# Patient Record
Sex: Male | Born: 1990 | Race: Black or African American | Hispanic: No | Marital: Single | Smoking: Current every day smoker
Health system: Southern US, Community
[De-identification: ages and names within clinical notes are randomized; demographics above are authoritative.]

---
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
## 2009-05-01 IMAGING — CT CT ABDOMEN W/ CM
2 of 5 series · 17 of 46 positions shown, 19 images · IV contrast (OMNI 300/WATER & 80 ML OMNI 300)
Comparison: None

CT ABDOMEN

CLINICAL DATA: Diarrhea urinary frequency

CT ABDOMEN AND PELVIS WITH CONTRAST
TECHNIQUE: Multidetector CT imaging of the abdomen and pelvis was
performed using the standard protocol following bolus
administration of intravenous contrast.
Contrast: 80 ml Mmnipaque-SLL IV

[Series 2: routine abdomen · axial · 0.61mm/px · z∈[-445,-60]mm · 14 of 87 slices shown, 16 images]
[im 5/87  soft-tissue]
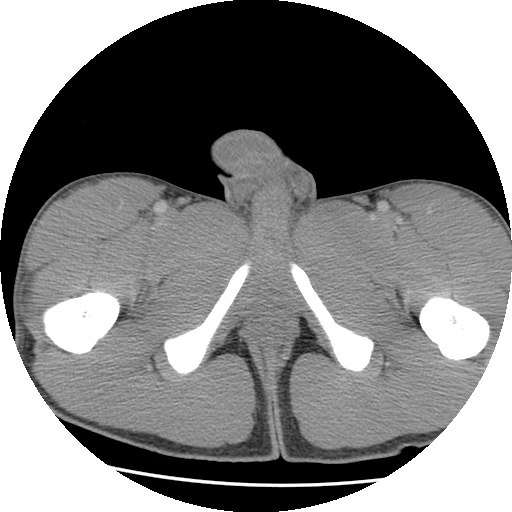
[im 5/87  bone]
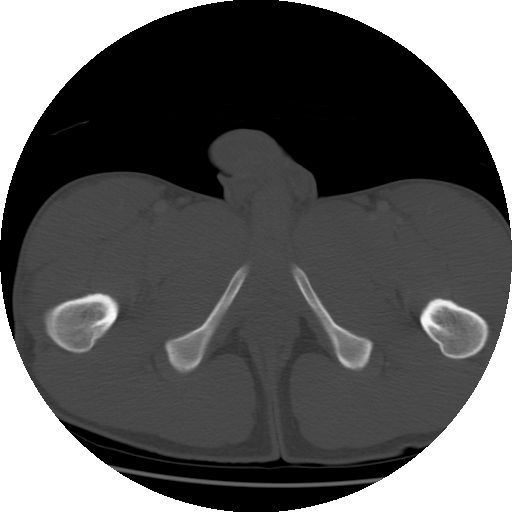
[im 10/87  soft-tissue]
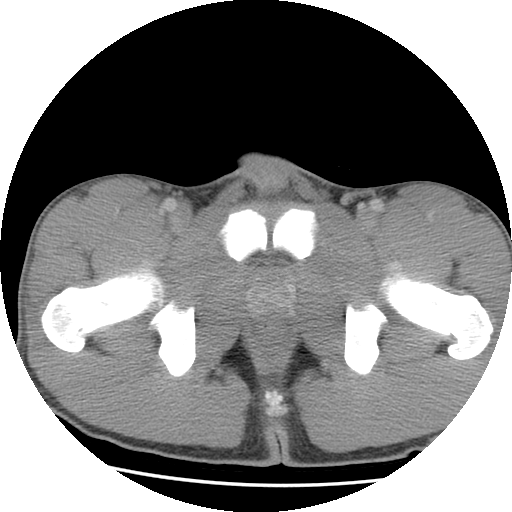
[im 19/87  soft-tissue]
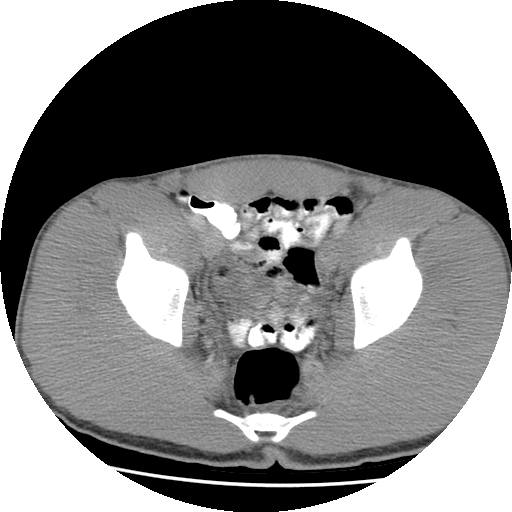
[im 23/87  soft-tissue]
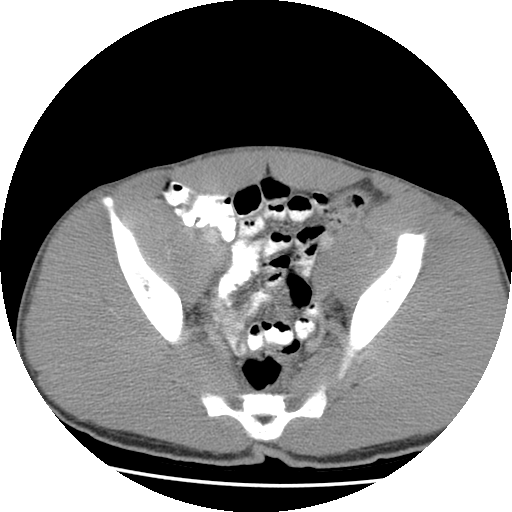
[im 28/87  soft-tissue]
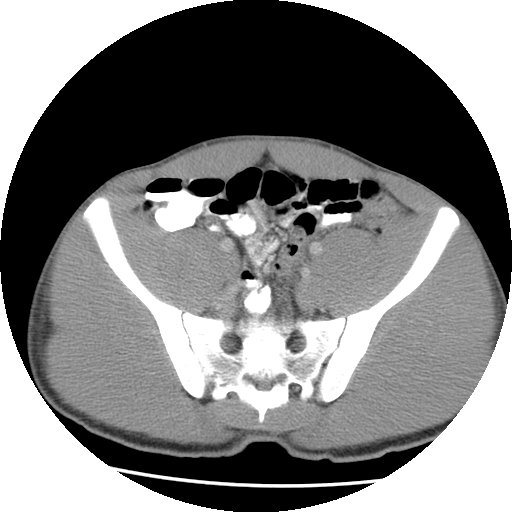
[im 37/87  soft-tissue]
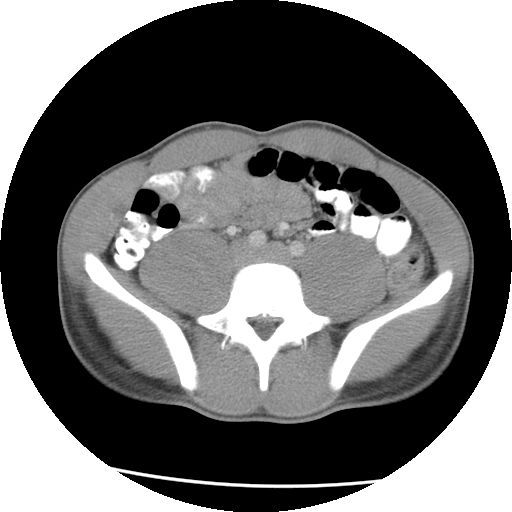
[im 41/87  soft-tissue]
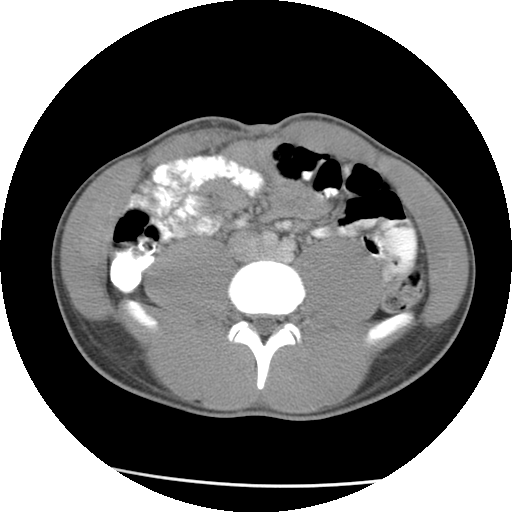
[im 46/87  soft-tissue]
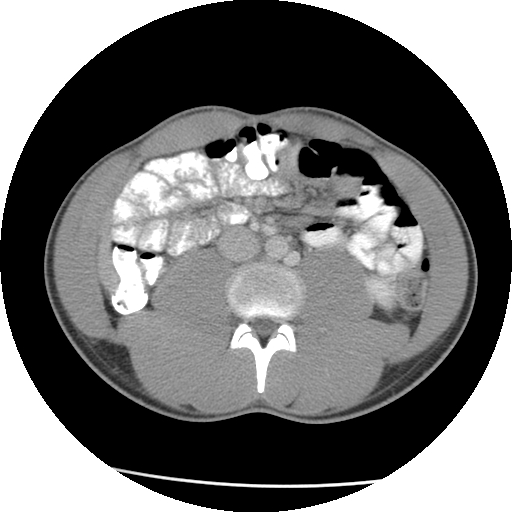
[im 50/87  soft-tissue]
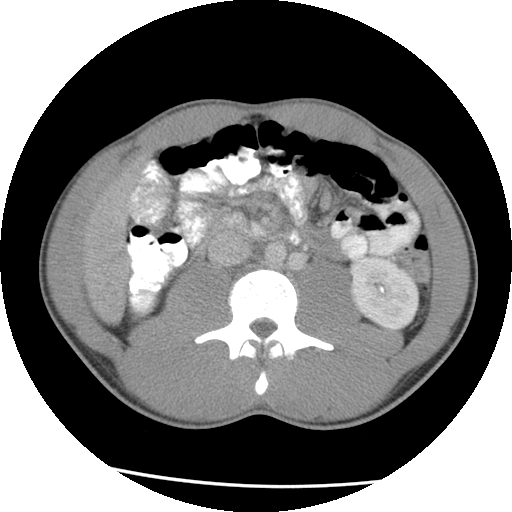
[im 50/87  bone]
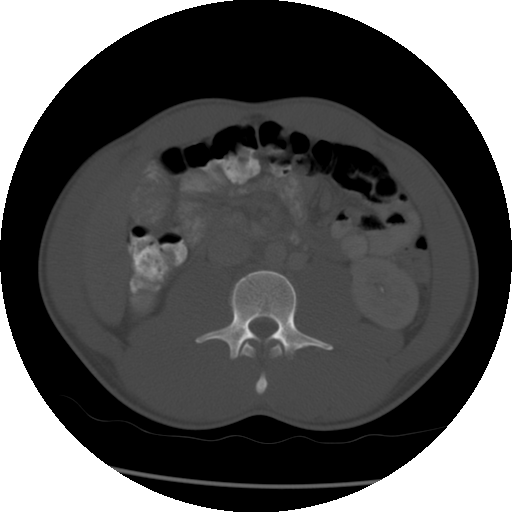
[im 59/87  soft-tissue]
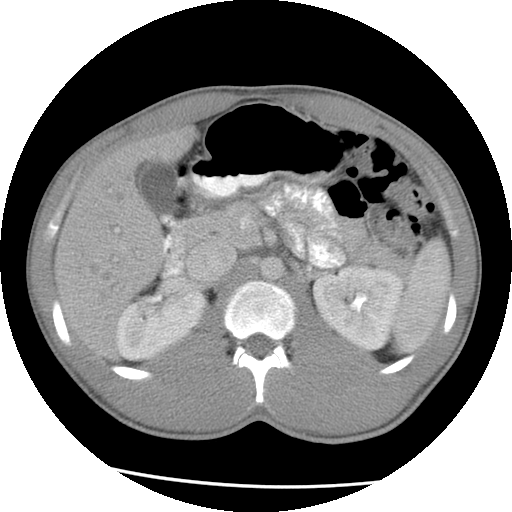
[im 64/87  soft-tissue]
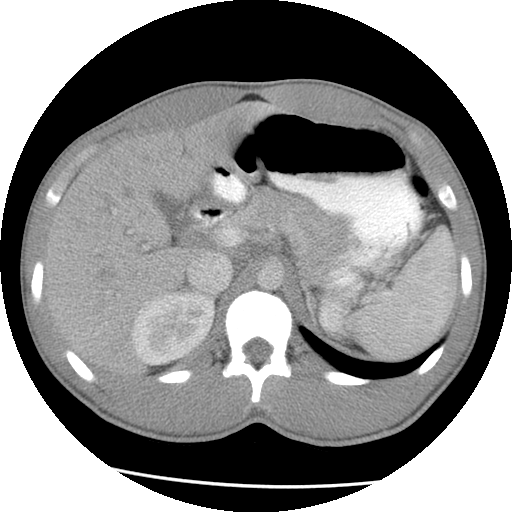
[im 68/87  soft-tissue]
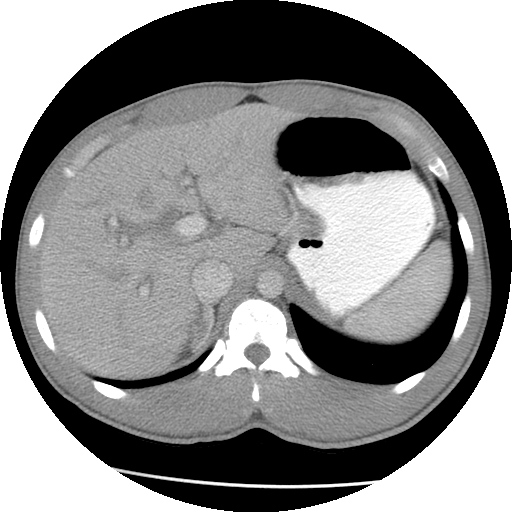
[im 77/87  soft-tissue]
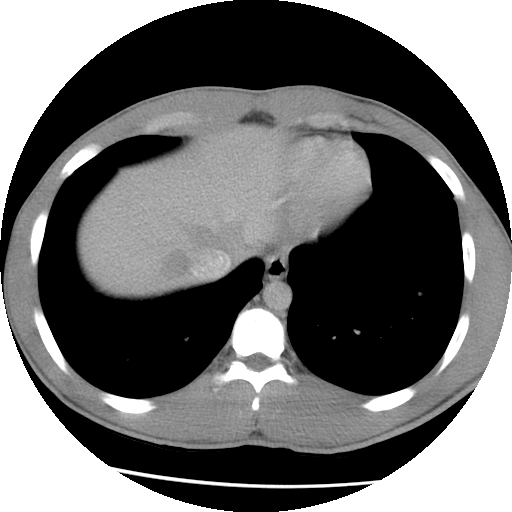
[im 82/87  soft-tissue]
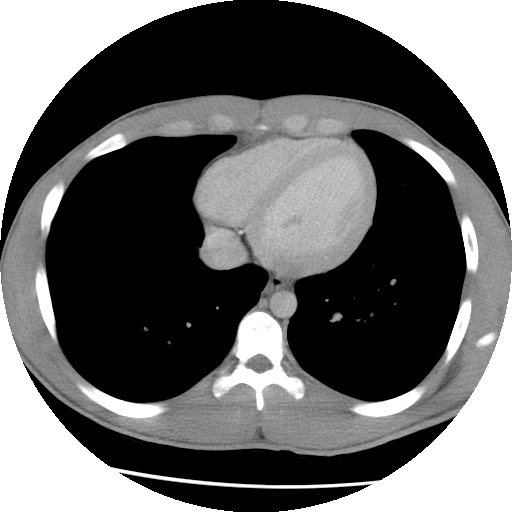

[Series 401: reformatted · coronal · 0.86mm/px · 3 of 102 slices shown]
[im 34/102  soft-tissue]
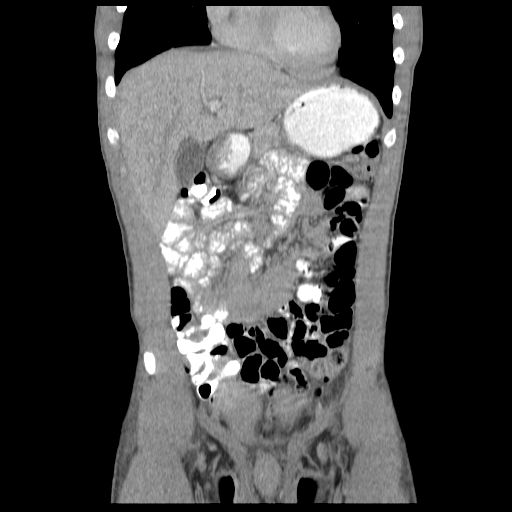
[im 45/102  soft-tissue]
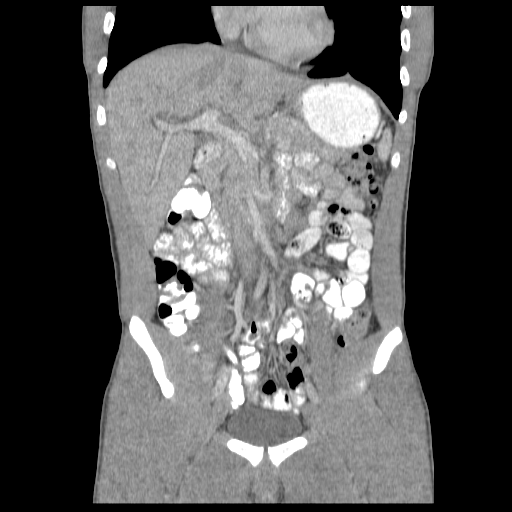
[im 57/102  soft-tissue]
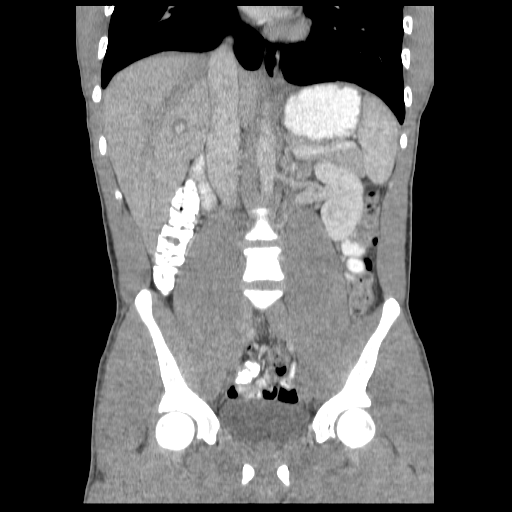

[17 of 46 positions shown; findings below may reference images not displayed]

FINDINGS: There is mild pericholecystic fluid around the
gallbladder.  Gallbladder wall does not appear to be thickened and
there is no ascites.  No calcified gallstones are present and there
is no biliary dilatation.  The liver spleen pancreas and kidneys
are normal the bowel is nondilated there is no adenopathy.
IMPRESSION: There is mild pericholecystic fluid.  Recommend physical
examination of the gallbladder to evaluate for tenderness.
Ultrasound may be helpful to evaluate the gallbladder for stones.

CT PELVIS
FINDINGS: The bowel appears normal and the appendix appears
normal.  There is no free fluid mass or adenopathy.
IMPRESSION: No acute abnormality in the abdomen.

## 2009-05-08 ENCOUNTER — Emergency Department (HOSPITAL_COMMUNITY): Admission: EM | Admit: 2009-05-08 | Discharge: 2009-05-08 | Payer: Self-pay | Admitting: Emergency Medicine

## 2009-06-06 ENCOUNTER — Emergency Department (HOSPITAL_COMMUNITY): Admission: EM | Admit: 2009-06-06 | Discharge: 2009-06-06 | Payer: Self-pay | Admitting: Emergency Medicine

## 2009-07-09 ENCOUNTER — Emergency Department (HOSPITAL_COMMUNITY): Admission: EM | Admit: 2009-07-09 | Discharge: 2009-07-09 | Payer: Self-pay | Admitting: Emergency Medicine

## 2009-08-21 ENCOUNTER — Emergency Department (HOSPITAL_COMMUNITY): Admission: EM | Admit: 2009-08-21 | Discharge: 2009-08-21 | Payer: Self-pay | Admitting: Emergency Medicine

## 2009-09-19 ENCOUNTER — Emergency Department (HOSPITAL_COMMUNITY): Admission: EM | Admit: 2009-09-19 | Discharge: 2009-09-20 | Payer: Self-pay | Admitting: Emergency Medicine

## 2010-03-04 ENCOUNTER — Emergency Department (HOSPITAL_COMMUNITY): Admission: EM | Admit: 2010-03-04 | Discharge: 2010-03-04 | Payer: Self-pay | Admitting: Emergency Medicine

## 2010-03-05 ENCOUNTER — Emergency Department (HOSPITAL_COMMUNITY): Admission: EM | Admit: 2010-03-05 | Discharge: 2010-03-05 | Payer: Self-pay | Admitting: Emergency Medicine

## 2010-12-04 ENCOUNTER — Emergency Department (HOSPITAL_COMMUNITY)
Admission: EM | Admit: 2010-12-04 | Discharge: 2010-12-04 | Payer: Self-pay | Source: Home / Self Care | Admitting: Emergency Medicine

## 2011-02-14 LAB — URINE MICROSCOPIC-ADD ON

## 2011-02-14 LAB — URINALYSIS, ROUTINE W REFLEX MICROSCOPIC
Bilirubin Urine: NEGATIVE
Glucose, UA: NEGATIVE mg/dL
Ketones, ur: NEGATIVE mg/dL
Protein, ur: NEGATIVE mg/dL
Urobilinogen, UA: 1 mg/dL (ref 0.0–1.0)
pH: 5.5 (ref 5.0–8.0)

## 2011-02-17 LAB — POCT I-STAT, CHEM 8
BUN: 12 mg/dL (ref 6–23)
Calcium, Ion: 1.16 mmol/L (ref 1.12–1.32)
Chloride: 103 mEq/L (ref 96–112)
Creatinine, Ser: 1.4 mg/dL (ref 0.4–1.5)
Glucose, Bld: 89 mg/dL (ref 70–99)
TCO2: 27 mmol/L (ref 0–100)

## 2011-02-17 LAB — POCT CARDIAC MARKERS: Myoglobin, poc: 39.9 ng/mL (ref 12–200)

## 2011-02-18 LAB — GC/CHLAMYDIA PROBE AMP, GENITAL
Chlamydia, DNA Probe: NEGATIVE
GC Probe Amp, Genital: NEGATIVE

## 2011-02-18 LAB — URINALYSIS, ROUTINE W REFLEX MICROSCOPIC
Nitrite: NEGATIVE
Protein, ur: NEGATIVE mg/dL
Urobilinogen, UA: 1 mg/dL (ref 0.0–1.0)

## 2011-02-18 LAB — URINE MICROSCOPIC-ADD ON

## 2011-02-19 LAB — GC/CHLAMYDIA PROBE AMP, GENITAL: GC Probe Amp, Genital: NEGATIVE

## 2011-08-06 LAB — DIFFERENTIAL
Eosinophils Relative: 1
Lymphs Abs: 1.4
Monocytes Relative: 12
Neutro Abs: 2.8
Neutrophils Relative %: 58

## 2011-08-06 LAB — POCT I-STAT, CHEM 8
BUN: 9
Calcium, Ion: 1.19
Calcium, Ion: 1.2
Chloride: 103
Creatinine, Ser: 1.4
Creatinine, Ser: 1.5
HCT: 47
HCT: 49
Potassium: 4.5
Sodium: 143
TCO2: 30

## 2011-08-06 LAB — URINALYSIS, ROUTINE W REFLEX MICROSCOPIC
Hgb urine dipstick: NEGATIVE
Ketones, ur: NEGATIVE
Nitrite: NEGATIVE
Specific Gravity, Urine: 1.02

## 2011-08-06 LAB — CBC
Hemoglobin: 14.9
RDW: 13.4

## 2011-08-06 LAB — URINE MICROSCOPIC-ADD ON

## 2011-08-07 LAB — URINE CULTURE: Colony Count: NO GROWTH

## 2011-08-07 LAB — URINALYSIS, ROUTINE W REFLEX MICROSCOPIC
Bilirubin Urine: NEGATIVE
Glucose, UA: NEGATIVE
Hgb urine dipstick: NEGATIVE
Ketones, ur: NEGATIVE
Nitrite: NEGATIVE
pH: 8.5 — ABNORMAL HIGH

## 2011-08-07 LAB — URINE MICROSCOPIC-ADD ON

## 2011-08-07 LAB — GC/CHLAMYDIA PROBE AMP, GENITAL: Chlamydia, DNA Probe: NEGATIVE

## 2011-08-09 LAB — URINALYSIS, ROUTINE W REFLEX MICROSCOPIC
Bilirubin Urine: NEGATIVE
Glucose, UA: NEGATIVE
Hgb urine dipstick: NEGATIVE
Ketones, ur: 15 — AB
Protein, ur: NEGATIVE

## 2011-08-09 LAB — URINE MICROSCOPIC-ADD ON

## 2011-08-09 LAB — RPR: RPR Ser Ql: NONREACTIVE

## 2011-08-09 LAB — GC/CHLAMYDIA PROBE AMP, GENITAL: GC Probe Amp, Genital: NEGATIVE

## 2011-08-20 LAB — GC/CHLAMYDIA PROBE AMP, GENITAL: Chlamydia, DNA Probe: NEGATIVE

## 2011-08-20 LAB — URINALYSIS, ROUTINE W REFLEX MICROSCOPIC
Bilirubin Urine: NEGATIVE
Glucose, UA: NEGATIVE
Ketones, ur: 15 — AB
Nitrite: NEGATIVE
Protein, ur: NEGATIVE

## 2013-02-15 ENCOUNTER — Emergency Department (HOSPITAL_COMMUNITY): Payer: No Typology Code available for payment source

## 2013-02-15 ENCOUNTER — Emergency Department (HOSPITAL_COMMUNITY)
Admission: EM | Admit: 2013-02-15 | Discharge: 2013-02-16 | Disposition: A | Discharge status: Home / Self Care | Payer: No Typology Code available for payment source | Attending: Emergency Medicine | Admitting: Emergency Medicine

## 2013-02-15 ENCOUNTER — Encounter (HOSPITAL_COMMUNITY): Payer: Self-pay | Admitting: *Deleted

## 2013-02-15 DIAGNOSIS — Y9389 Activity, other specified: Secondary | ICD-10-CM | POA: Insufficient documentation

## 2013-02-15 DIAGNOSIS — Y92410 Unspecified street and highway as the place of occurrence of the external cause: Secondary | ICD-10-CM | POA: Insufficient documentation

## 2013-02-15 DIAGNOSIS — S7001XA Contusion of right hip, initial encounter: Secondary | ICD-10-CM

## 2013-02-15 DIAGNOSIS — S7000XA Contusion of unspecified hip, initial encounter: Secondary | ICD-10-CM | POA: Insufficient documentation

## 2013-02-15 DIAGNOSIS — F17200 Nicotine dependence, unspecified, uncomplicated: Secondary | ICD-10-CM | POA: Insufficient documentation

## 2013-02-15 MED ORDER — IOHEXOL 300 MG/ML  SOLN
100.0000 mL | Freq: Once | INTRAMUSCULAR | Status: AC | PRN
  Administered 2013-02-15: 100 mL via INTRAVENOUS

## 2013-02-15 MED ORDER — FENTANYL CITRATE 0.05 MG/ML IJ SOLN
50.0000 ug | Freq: Once | INTRAMUSCULAR | Status: AC
  Administered 2013-02-15: 50 ug via INTRAVENOUS
  Filled 2013-02-15: qty 2

## 2013-02-15 MED ORDER — SODIUM CHLORIDE 0.9 % IV BOLUS (SEPSIS)
1000.0000 mL | Freq: Once | INTRAVENOUS | Status: AC
  Administered 2013-02-15: 1000 mL via INTRAVENOUS
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
## 2013-02-15 NOTE — ED Notes (Signed)
Xray finished at [facility] with portable chest and pelvis.
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
## 2013-02-15 NOTE — ED Notes (Signed)
Back from radiology, no changes, alert, NAD, calm, friends at [facility] x2.

## 2013-02-15 NOTE — ED Notes (Signed)
[employer] speaking with pt at [facility] 

## 2013-02-15 NOTE — ED Notes (Signed)
Pt speaking on phone, no changes, alert, NAD, calm, pending CT scans.

## 2013-02-16 MED ORDER — OXYCODONE-ACETAMINOPHEN 5-325 MG PO TABS: 1.0000 | ORAL_TABLET | Freq: Four times a day (QID) | ORAL | Status: AC | PRN

## 2013-02-16 MED ORDER — IBUPROFEN 800 MG PO TABS: 800.0000 mg | ORAL_TABLET | Freq: Three times a day (TID) | ORAL | Status: AC | PRN

## 2013-12-21 ENCOUNTER — Encounter (HOSPITAL_COMMUNITY): Payer: Self-pay | Admitting: Emergency Medicine

## 2013-12-21 ENCOUNTER — Emergency Department (INDEPENDENT_AMBULATORY_CARE_PROVIDER_SITE_OTHER)
Admission: EM | Admit: 2013-12-21 | Discharge: 2013-12-21 | Disposition: A | Discharge status: Home / Self Care | Payer: Self-pay | Source: Home / Self Care | Attending: Family Medicine | Admitting: Family Medicine

## 2013-12-21 DIAGNOSIS — M549 Dorsalgia, unspecified: Secondary | ICD-10-CM

## 2013-12-21 MED ORDER — CYCLOBENZAPRINE HCL 5 MG PO TABS: 5.0000 mg | ORAL_TABLET | Freq: Three times a day (TID) | ORAL | Status: AC | PRN

## 2013-12-21 MED ORDER — IBUPROFEN 800 MG PO TABS: 800.0000 mg | ORAL_TABLET | Freq: Three times a day (TID) | ORAL | Status: AC
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
## 2018-07-01 ENCOUNTER — Ambulatory Visit (HOSPITAL_COMMUNITY)
Admission: EM | Admit: 2018-07-01 | Discharge: 2018-07-01 | Disposition: A | Discharge status: Home / Self Care | Payer: Self-pay | Attending: Family Medicine | Admitting: Family Medicine

## 2018-07-01 ENCOUNTER — Encounter (HOSPITAL_COMMUNITY): Payer: Self-pay

## 2018-07-01 ENCOUNTER — Other Ambulatory Visit: Payer: Self-pay

## 2018-07-01 DIAGNOSIS — L02412 Cutaneous abscess of left axilla: Secondary | ICD-10-CM

## 2018-07-01 MED ORDER — LIDOCAINE HCL 2 % IJ SOLN
INTRAMUSCULAR | Status: AC
  Filled 2018-07-01: qty 20

## 2018-07-01 MED ORDER — CEPHALEXIN 500 MG PO CAPS: 500.0000 mg | ORAL_CAPSULE | Freq: Four times a day (QID) | ORAL | 0 refills | Status: AC

## 2018-07-01 NOTE — ED Provider Notes (Signed)
[facility]    CSN: [id number] Arrival date & time: 07/01/18  [id number]     History   Chief Complaint Chief Complaint  Patient presents with  . Abscess    HPI Brett Dunn is a 27 y.o. male.   Healthy [age], male that presents with abscess to left axilla x1 week and worsening.  He denies any treatment at home.  He denies any drainage from the area.  It is been very painful 8 out of 10.  He has not taken anything for pain.  He denies any fever, chills, body aches, fatigue, night sweats. No recent insect bites or rashes.   ROS per HPI      History reviewed. No pertinent past medical history.  There are no active problems to display for this patient.   History reviewed. No pertinent surgical history.     Home Medications    Prior to Admission medications   Medication Sig Start Date End Date Taking? Authorizing Provider  cephALEXin (KEFLEX) 500 MG capsule Take 1 capsule (500 mg total) by mouth 4 (four) times daily. 07/01/18   Dahlia ByesBast, Kassidee Narciso A, NP  cyclobenzaprine (FLEXERIL) 5 MG tablet Take 1 tablet (5 mg total) by mouth 3 (three) times daily as needed for muscle spasms. Patient not taking: Reported on 07/01/2018 12/21/13   Linna HoffKindl, James D, MD  ibuprofen (ADVIL,MOTRIN) 800 MG tablet Take 1 tablet (800 mg total) by mouth every 8 (eight) hours as needed for pain. Patient not taking: Reported on 07/01/2018 02/16/13   Susy FrizzleSheldon, Charles, MD  ibuprofen (ADVIL,MOTRIN) 800 MG tablet Take 1 tablet (800 mg total) by mouth 3 (three) times daily. For back pain Patient not taking: Reported on 07/01/2018 12/21/13   Linna HoffKindl, James D, MD  oxyCODONE-acetaminophen (PERCOCET/ROXICET) 5-325 MG per tablet Take 1-2 tablets by mouth every 6 (six) hours as needed for pain. Patient not taking: Reported on 07/01/2018 02/16/13   Susy FrizzleSheldon, Charles, MD    Family History No family history on file.  Social History Social History   Tobacco Use  . Smoking status: Current Every Day Smoker  . Smokeless  tobacco: Current User  Substance Use Topics  . Alcohol use: No  . Drug use: Yes    Types: Marijuana     Allergies   Patient has no known allergies.   Review of Systems Review of Systems   Physical Exam Triage Vital Signs ED Triage Vitals  Enc Vitals Group     BP 07/01/18 1027 110/72     Pulse Rate 07/01/18 1027 90     Resp 07/01/18 1027 16     Temp 07/01/18 1027 98.4 F (36.9 C)     Temp Source 07/01/18 1027 Oral     SpO2 07/01/18 1027 100 %     Weight 07/01/18 1028 135 lb (61.2 kg)     Height --      Head Circumference --      Peak Flow --      Pain Score 07/01/18 1028 9     Pain Loc --      Pain Edu? --      Excl. in GC? --    No data found.  Updated Vital Signs BP 110/72   Pulse 90   Temp 98.4 F (36.9 C) (Oral)   Resp 16   Wt 135 lb (61.2 kg)   SpO2 100%   Visual Acuity Right Eye Distance:   Left Eye Distance:   Bilateral Distance:    Right Eye  Near:   Left Eye Near:    Bilateral Near:     Physical Exam  Constitutional: He is oriented to person, place, and time. He appears well-developed and well-nourished.  HENT:  Head: Normocephalic and atraumatic.  Neck: Normal range of motion.  Pulmonary/Chest: Effort normal.  Neurological: He is alert and oriented to person, place, and time.  Skin: Skin is warm and dry.  2 x 1 cm abscess to left axilla.  Approximately half centimeter fluctuance and center of abscess.  Mild erythema.  No streaking.   Psychiatric: He has a normal mood and affect.  Nursing note and vitals reviewed.    UC Treatments / Results  Labs (all labs ordered are listed, but only abnormal results are displayed) Labs Reviewed - No data to display  EKG None  Radiology No results found.  Procedures Procedures (including critical care time)  Medications Ordered in UC Medications - No data to display  Initial Impression / Assessment and Plan / UC Course  I have reviewed the triage vital signs and the nursing  notes.  Pertinent labs & imaging results that were available during my care of the patient were reviewed by me and considered in my medical decision making (see chart for details).     Patient refused I&D. We will go ahead and treat with antibiotics and warm compresses. Told patient to follow-up in the next couple of days if he is not seeing any improvement in symptoms or getting worse. Pt agreeable to plan.  Final Clinical Impressions(s) / UC Diagnoses   Final diagnoses:  Abscess of left axilla     Discharge Instructions     It was nice meeting you!!  We will try to do the warm compresses and antibiotics as you have requested.  Please come back if no improvement or worsening in the next few days.  It may start to drain on its own. If it does keep covered and clean with mild soap and water.     ED Prescriptions    Medication Sig Dispense Auth. Provider   cephALEXin (KEFLEX) 500 MG capsule Take 1 capsule (500 mg total) by mouth 4 (four) times daily. 28 capsule Dahlia ByesBast, Salima Rumer A, NP     Controlled Substance Prescriptions [place] Controlled Substance Registry consulted? Not Applicable   Janace ArisBast, Idaly Verret A, NP 07/01/18 1231

## 2018-07-01 NOTE — ED Triage Notes (Signed)
Abscess underneath the armpit.

## 2018-07-01 NOTE — Discharge Instructions (Signed)
It was nice meeting you!!  We will try to do the warm compresses and antibiotics as you have requested.  Please come back if no improvement or worsening in the next few days.  It may start to drain on its own. If it does keep covered and clean with mild soap and water.

## 2021-04-14 ENCOUNTER — Other Ambulatory Visit: Payer: Self-pay

## 2021-04-14 ENCOUNTER — Emergency Department (HOSPITAL_COMMUNITY)
Admission: EM | Admit: 2021-04-14 | Discharge: 2021-04-14 | Disposition: A | Discharge status: Home / Self Care | Payer: Medicaid Other | Attending: Emergency Medicine | Admitting: Emergency Medicine

## 2021-04-14 ENCOUNTER — Encounter (HOSPITAL_COMMUNITY): Payer: Self-pay

## 2021-04-14 ENCOUNTER — Emergency Department (HOSPITAL_COMMUNITY): Payer: Medicaid Other

## 2021-04-14 DIAGNOSIS — Z20822 Contact with and (suspected) exposure to covid-19: Secondary | ICD-10-CM | POA: Insufficient documentation

## 2021-04-14 DIAGNOSIS — F17200 Nicotine dependence, unspecified, uncomplicated: Secondary | ICD-10-CM | POA: Insufficient documentation

## 2021-04-14 DIAGNOSIS — E860 Dehydration: Secondary | ICD-10-CM | POA: Insufficient documentation

## 2021-04-14 DIAGNOSIS — R569 Unspecified convulsions: Secondary | ICD-10-CM | POA: Insufficient documentation

## 2021-04-14 LAB — RESP PANEL BY RT-PCR (FLU A&B, COVID) ARPGX2
Influenza A by PCR: NEGATIVE
Influenza B by PCR: NEGATIVE
SARS Coronavirus 2 by RT PCR: NEGATIVE

## 2021-04-14 LAB — COMPREHENSIVE METABOLIC PANEL
ALT: 12 U/L (ref 0–44)
AST: 28 U/L (ref 15–41)
Albumin: 4.6 g/dL (ref 3.5–5.0)
Alkaline Phosphatase: 59 U/L (ref 38–126)
Anion gap: 19 — ABNORMAL HIGH (ref 5–15)
BUN: 14 mg/dL (ref 6–20)
CO2: 15 mmol/L — ABNORMAL LOW (ref 22–32)
Calcium: 9.7 mg/dL (ref 8.9–10.3)
Chloride: 102 mmol/L (ref 98–111)
Creatinine, Ser: 1.79 mg/dL — ABNORMAL HIGH (ref 0.61–1.24)
GFR, Estimated: 51 mL/min — ABNORMAL LOW (ref >60)
Glucose, Bld: 162 mg/dL — ABNORMAL HIGH (ref 70–99)
Potassium: 4.2 mmol/L (ref 3.5–5.1)
Sodium: 136 mmol/L (ref 135–145)
Total Bilirubin: 0.3 mg/dL (ref 0.3–1.2)
Total Protein: 8.2 g/dL — ABNORMAL HIGH (ref 6.5–8.1)

## 2021-04-14 LAB — CBC WITH DIFFERENTIAL/PLATELET
Abs Immature Granulocytes: 0.08 10*3/uL — ABNORMAL HIGH (ref 0.00–0.07)
Basophils Absolute: 0 10*3/uL (ref 0.0–0.1)
Basophils Relative: 0 %
Eosinophils Absolute: 0.1 10*3/uL (ref 0.0–0.5)
Eosinophils Relative: 1 %
HCT: 47.4 % (ref 39.0–52.0)
Hemoglobin: 14.9 g/dL (ref 13.0–17.0)
Immature Granulocytes: 1 %
Lymphocytes Relative: 30 %
Lymphs Abs: 2.4 10*3/uL (ref 0.7–4.0)
MCH: 27.9 pg (ref 26.0–34.0)
MCHC: 31.4 g/dL (ref 30.0–36.0)
MCV: 88.6 fL (ref 80.0–100.0)
Monocytes Absolute: 0.8 10*3/uL (ref 0.1–1.0)
Monocytes Relative: 10 %
Neutro Abs: 4.7 10*3/uL (ref 1.7–7.7)
Neutrophils Relative %: 58 %
Platelets: 313 10*3/uL (ref 150–400)
RBC: 5.35 MIL/uL (ref 4.22–5.81)
RDW: 13.7 % (ref 11.5–15.5)
WBC: 8 10*3/uL (ref 4.0–10.5)
nRBC: 0 % (ref 0.0–0.2)

## 2021-04-14 LAB — CBG MONITORING, ED: Glucose-Capillary: 147 mg/dL — ABNORMAL HIGH (ref 70–99)

## 2021-04-14 LAB — ETHANOL: Alcohol, Ethyl (B): <10 mg/dL (ref <10)

## 2021-04-14 MED ORDER — LACTATED RINGERS IV BOLUS
1000.0000 mL | Freq: Once | INTRAVENOUS | Status: AC
  Administered 2021-04-14: 1000 mL via INTRAVENOUS

## 2021-04-14 MED ORDER — LACTATED RINGERS IV SOLN: INTRAVENOUS | Status: DC

## 2021-04-14 NOTE — ED Provider Notes (Signed)
[facility] EMERGENCY DEPARTMENT Provider Note   CSN: [id number] Arrival date & time: 04/14/21  1006     History Chief Complaint  Patient presents with  . Seizures    Brett Dunn is a 30 y.o. male.  30 year old male presents after having a reported seizure.  Patient had been up all night.  According to the patient's father, patient does use cocaine.  Is unclear if he uses yesterday evening.  He has no prior history of seizures.  Does not take any medications regularly.  Patient confused on arrival here and history is limited        History reviewed. No pertinent past medical history.  There are no problems to display for this patient.   History reviewed. No pertinent surgical history.     No family history on file.  Social History   Tobacco Use  . Smoking status: Current Every Day Smoker  . Smokeless tobacco: Current User  Substance Use Topics  . Alcohol use: No  . Drug use: Yes    Types: Marijuana, Cocaine    Home Medications Prior to Admission medications   Medication Sig Start Date End Date Taking? Authorizing Provider  cephALEXin (KEFLEX) 500 MG capsule Take 1 capsule (500 mg total) by mouth 4 (four) times daily. 07/01/18   Dahlia Byes A, NP  cyclobenzaprine (FLEXERIL) 5 MG tablet Take 1 tablet (5 mg total) by mouth 3 (three) times daily as needed for muscle spasms. Patient not taking: Reported on 07/01/2018 12/21/13   Linna Hoff, MD  ibuprofen (ADVIL,MOTRIN) 800 MG tablet Take 1 tablet (800 mg total) by mouth every 8 (eight) hours as needed for pain. Patient not taking: Reported on 07/01/2018 02/16/13   Pollyann Savoy, MD  ibuprofen (ADVIL,MOTRIN) 800 MG tablet Take 1 tablet (800 mg total) by mouth 3 (three) times daily. For back pain Patient not taking: Reported on 07/01/2018 12/21/13   Linna Hoff, MD  oxyCODONE-acetaminophen (PERCOCET/ROXICET) 5-325 MG per tablet Take 1-2 tablets by mouth every 6 (six) hours as needed for  pain. Patient not taking: Reported on 07/01/2018 02/16/13   Pollyann Savoy, MD    Allergies    Patient has no known allergies.  Review of Systems   Review of Systems  Unable to perform ROS: Acuity of condition    Physical Exam Updated Vital Signs BP 119/86   Temp (!) 96.8 F (36 C) (Temporal)   Resp 19   Ht 1.676 m (5\' 6" )   Wt 61.2 kg   SpO2 95%   BMI 21.78 kg/m   Physical Exam Vitals and nursing note reviewed.  Constitutional:      General: He is not in acute distress.    Appearance: Normal appearance. He is well-developed. He is not toxic-appearing.  HENT:     Head: Normocephalic and atraumatic.  Eyes:     General: Lids are normal.     Conjunctiva/sclera: Conjunctivae normal.     Pupils: Pupils are equal, round, and reactive to light.  Neck:     Thyroid: No thyroid mass.     Trachea: No tracheal deviation.  Cardiovascular:     Rate and Rhythm: Normal rate and regular rhythm.     Heart sounds: Normal heart sounds. No murmur heard. No gallop.   Pulmonary:     Effort: Pulmonary effort is normal. No respiratory distress.     Breath sounds: Normal breath sounds. No stridor. No decreased breath sounds, wheezing, rhonchi or rales.  Abdominal:     General: Bowel sounds are normal. There is no distension.     Palpations: Abdomen is soft.     Tenderness: There is no abdominal tenderness. There is no rebound.  Musculoskeletal:        General: No tenderness. Normal range of motion.     Cervical back: Normal range of motion and neck supple.  Skin:    General: Skin is warm and dry.     Findings: No abrasion or rash.  Neurological:     Mental Status: He is oriented to person, place, and time. He is lethargic.     GCS: GCS eye subscore is 4. GCS verbal subscore is 5. GCS motor subscore is 6.     Cranial Nerves: No cranial nerve deficit.     Sensory: No sensory deficit.     Comments: Patient moves all 4 extremities at this time.  Psychiatric:        Attention and  Perception: He is inattentive.        Speech: Speech is delayed.        Behavior: Behavior is slowed.     ED Results / Procedures / Treatments   Labs (all labs ordered are listed, but only abnormal results are displayed) Labs Reviewed  RESP PANEL BY RT-PCR (FLU A&B, COVID) ARPGX2  ETHANOL  RAPID URINE DRUG SCREEN, HOSP PERFORMED  CBC WITH DIFFERENTIAL/PLATELET  COMPREHENSIVE METABOLIC PANEL    EKG None  Radiology No results found.  Procedures Procedures   Medications Ordered in ED Medications  lactated ringers infusion (has no administration in time range)  lactated ringers bolus 1,000 mL (has no administration in time range)    ED Course  I have reviewed the triage vital signs and the nursing notes.  Pertinent labs & imaging results that were available during my care of the patient were reviewed by me and considered in my medical decision making (see chart for details).    MDM Rules/Calculators/A&P                          Patient allowed to rest urinalysis much more alert and interactive.  Has evidence of dehydration.  Given 2 L of fluid here.  Will recheck patient has been made to see if this improvement.  Will reassess  2:45 PM Patient given 2 half liters of fluid and feels better.  Likely dehydrated as cause of his electrolyte abnormalities.  He is awake and alert now.  States that he had been up all night gambling.  And has had little sleep Final Clinical Impression(s) / ED Diagnoses Final diagnoses:  None    Rx / DC Orders ED Discharge Orders    None       Lorre Nick, MD 04/14/21 1452

## 2021-04-14 NOTE — ED Triage Notes (Signed)
Pt arrived POV, pt's friend witnessed pt having a seizure right outside of hospital. Pt was post tictal on arrival. Pt is responsive to verbal and painful stimuli. Pt keeps moving around as nurse is placing IV. VSS.

## 2021-04-14 NOTE — ED Notes (Signed)
Patient transported to CT 

## 2022-06-27 ENCOUNTER — Emergency Department (HOSPITAL_COMMUNITY)
Admission: EM | Admit: 2022-06-27 | Discharge: 2022-06-27 | Disposition: A | Discharge status: Home / Self Care | Payer: Self-pay | Attending: Emergency Medicine | Admitting: Emergency Medicine

## 2022-06-27 ENCOUNTER — Other Ambulatory Visit: Payer: Self-pay

## 2022-06-27 ENCOUNTER — Encounter (HOSPITAL_COMMUNITY): Payer: Self-pay

## 2022-06-27 ENCOUNTER — Emergency Department (HOSPITAL_COMMUNITY): Payer: Medicaid Other

## 2022-06-27 ENCOUNTER — Emergency Department (HOSPITAL_COMMUNITY)
Admission: EM | Admit: 2022-06-27 | Discharge: 2022-06-27 | Discharge status: Against Medical Advice | Payer: Medicaid Other

## 2022-06-27 DIAGNOSIS — R1110 Vomiting, unspecified: Secondary | ICD-10-CM | POA: Insufficient documentation

## 2022-06-27 DIAGNOSIS — R4182 Altered mental status, unspecified: Secondary | ICD-10-CM

## 2022-06-27 DIAGNOSIS — F1410 Cocaine abuse, uncomplicated: Secondary | ICD-10-CM

## 2022-06-27 LAB — COMPREHENSIVE METABOLIC PANEL
ALT: 29 U/L (ref 0–44)
AST: 32 U/L (ref 15–41)
Albumin: 4.1 g/dL (ref 3.5–5.0)
Alkaline Phosphatase: 70 U/L (ref 38–126)
Anion gap: 5 (ref 5–15)
BUN: 10 mg/dL (ref 6–20)
CO2: 26 mmol/L (ref 22–32)
Calcium: 8.8 mg/dL — ABNORMAL LOW (ref 8.9–10.3)
Chloride: 109 mmol/L (ref 98–111)
Creatinine, Ser: 1.1 mg/dL (ref 0.61–1.24)
GFR, Estimated: >60 mL/min (ref >60)
Glucose, Bld: 126 mg/dL — ABNORMAL HIGH (ref 70–99)
Potassium: 4.4 mmol/L (ref 3.5–5.1)
Sodium: 140 mmol/L (ref 135–145)
Total Bilirubin: 0.4 mg/dL (ref 0.3–1.2)
Total Protein: 7 g/dL (ref 6.5–8.1)

## 2022-06-27 LAB — ACETAMINOPHEN LEVEL: Acetaminophen (Tylenol), Serum: <10 ug/mL — ABNORMAL LOW (ref 10–30)

## 2022-06-27 LAB — LIPASE, BLOOD: Lipase: 41 U/L (ref 11–51)

## 2022-06-27 LAB — I-STAT VENOUS BLOOD GAS, ED
Acid-Base Excess: 1 mmol/L (ref 0.0–2.0)
Bicarbonate: 29.9 mmol/L — ABNORMAL HIGH (ref 20.0–28.0)
Calcium, Ion: 1.11 mmol/L — ABNORMAL LOW (ref 1.15–1.40)
HCT: 44 % (ref 39.0–52.0)
Hemoglobin: 15 g/dL (ref 13.0–17.0)
O2 Saturation: 49 %
Potassium: 4.8 mmol/L (ref 3.5–5.1)
Sodium: 142 mmol/L (ref 135–145)
TCO2: 32 mmol/L (ref 22–32)
pCO2, Ven: 64.9 mmHg — ABNORMAL HIGH (ref 44–60)
pH, Ven: 7.272 (ref 7.25–7.43)
pO2, Ven: 31 mmHg — CL (ref 32–45)

## 2022-06-27 LAB — CBC WITH DIFFERENTIAL/PLATELET
Abs Immature Granulocytes: 0.03 10*3/uL (ref 0.00–0.07)
Basophils Absolute: 0 10*3/uL (ref 0.0–0.1)
Basophils Relative: 0 %
Eosinophils Absolute: 0 10*3/uL (ref 0.0–0.5)
Eosinophils Relative: 0 %
HCT: 41.4 % (ref 39.0–52.0)
Hemoglobin: 13.6 g/dL (ref 13.0–17.0)
Immature Granulocytes: 0 %
Lymphocytes Relative: 7 %
Lymphs Abs: 0.6 10*3/uL — ABNORMAL LOW (ref 0.7–4.0)
MCH: 33.9 pg (ref 26.0–34.0)
MCHC: 32.9 g/dL (ref 30.0–36.0)
MCV: 103.2 fL — ABNORMAL HIGH (ref 80.0–100.0)
Monocytes Absolute: 0.4 10*3/uL (ref 0.1–1.0)
Monocytes Relative: 5 %
Neutro Abs: 7.8 10*3/uL — ABNORMAL HIGH (ref 1.7–7.7)
Neutrophils Relative %: 88 %
Platelets: 323 10*3/uL (ref 150–400)
RBC: 4.01 MIL/uL — ABNORMAL LOW (ref 4.22–5.81)
RDW: 12.9 % (ref 11.5–15.5)
WBC: 9 10*3/uL (ref 4.0–10.5)
nRBC: 0 % (ref 0.0–0.2)

## 2022-06-27 LAB — RAPID URINE DRUG SCREEN, HOSP PERFORMED
Amphetamines: NOT DETECTED
Barbiturates: NOT DETECTED
Benzodiazepines: NOT DETECTED
Cocaine: POSITIVE — AB
Opiates: NOT DETECTED
Tetrahydrocannabinol: NOT DETECTED

## 2022-06-27 LAB — ETHANOL: Alcohol, Ethyl (B): <10 mg/dL (ref <10)

## 2022-06-27 LAB — SALICYLATE LEVEL: Salicylate Lvl: <7 mg/dL — ABNORMAL LOW (ref 7.0–30.0)

## 2022-06-27 MED ORDER — METOCLOPRAMIDE HCL 5 MG/ML IJ SOLN
10.0000 mg | Freq: Once | INTRAMUSCULAR | Status: AC
  Administered 2022-06-27: 10 mg via INTRAVENOUS
  Filled 2022-06-27: qty 2

## 2022-06-27 MED ORDER — IOHEXOL 300 MG/ML  SOLN
100.0000 mL | Freq: Once | INTRAMUSCULAR | Status: AC | PRN
  Administered 2022-06-27: 100 mL via INTRAVENOUS

## 2022-06-27 MED ORDER — LACTATED RINGERS IV BOLUS
1000.0000 mL | Freq: Once | INTRAVENOUS | Status: AC
  Administered 2022-06-27: 1000 mL via INTRAVENOUS

## 2022-06-27 MED ORDER — ONDANSETRON HCL 4 MG/2ML IJ SOLN
4.0000 mg | Freq: Once | INTRAMUSCULAR | Status: AC
  Administered 2022-06-27: 4 mg via INTRAVENOUS
  Filled 2022-06-27: qty 2

## 2022-06-27 NOTE — ED Triage Notes (Signed)
Pt BIB [facility] from an intersection laying naked. Pt reported he found something on the side of the road and decided to "drink it because I'm thirsty". Pt was combative with EMS. Per EMS pt 87% RA, placed on 2L [place]. Upon arrival, pt 100% RA.

## 2022-06-27 NOTE — ED Notes (Signed)
Pt called for triage again, no response.

## 2022-06-27 NOTE — ED Notes (Signed)
This RN allowed pt some ice chips but no water, pt proceeded to add water to it, pt then had an episode of emesis. Water taken away at this time.

## 2022-06-27 NOTE — ED Provider Notes (Signed)
[facility] EMERGENCY DEPARTMENT Provider Note   CSN: [id number] Arrival date & time: 06/27/22  0242     History  Chief Complaint  Patient presents with   Altered Mental Status   Emesis    Brett Dunn is a 31 y.o. male.  Patient presents to the emergency department by ambulance.  Patient was reportedly was found laying naked in an intersection.  Patient told EMS that he found a bottle of liquid on the side of the road and decided to drink it because he was thirsty.  EMS reports that he was initially combative.  They also documented low oxygen saturations of 87%.  He was placed on nasal cannula oxygen and transported.  He has had multiple episodes of vomiting during transport.       Home Medications Prior to Admission medications   Medication Sig Start Date End Date Taking? Authorizing Provider  cephALEXin (KEFLEX) 500 MG capsule Take 1 capsule (500 mg total) by mouth 4 (four) times daily. 07/01/18   Dahlia Byes A, NP  cyclobenzaprine (FLEXERIL) 5 MG tablet Take 1 tablet (5 mg total) by mouth 3 (three) times daily as needed for muscle spasms. Patient not taking: Reported on 07/01/2018 12/21/13   Linna Hoff, MD  ibuprofen (ADVIL,MOTRIN) 800 MG tablet Take 1 tablet (800 mg total) by mouth every 8 (eight) hours as needed for pain. Patient not taking: Reported on 07/01/2018 02/16/13   Pollyann Savoy, MD  ibuprofen (ADVIL,MOTRIN) 800 MG tablet Take 1 tablet (800 mg total) by mouth 3 (three) times daily. For back pain Patient not taking: Reported on 07/01/2018 12/21/13   Linna Hoff, MD  oxyCODONE-acetaminophen (PERCOCET/ROXICET) 5-325 MG per tablet Take 1-2 tablets by mouth every 6 (six) hours as needed for pain. Patient not taking: Reported on 07/01/2018 02/16/13   Pollyann Savoy, MD      Allergies    Patient has no known allergies.    Review of Systems   Review of Systems  Physical Exam Updated Vital Signs BP 132/77   Pulse 77   Temp 98.6 F (37  C)   Resp 17   SpO2 98%  Physical Exam Vitals and nursing note reviewed.  Constitutional:      General: He is not in acute distress.    Appearance: He is well-developed.  HENT:     Head: Normocephalic and atraumatic.     Mouth/Throat:     Mouth: Mucous membranes are moist.  Eyes:     General: Vision grossly intact. Gaze aligned appropriately.     Extraocular Movements: Extraocular movements intact.     Conjunctiva/sclera: Conjunctivae normal.  Cardiovascular:     Rate and Rhythm: Normal rate and regular rhythm.     Pulses: Normal pulses.     Heart sounds: Normal heart sounds, S1 normal and S2 normal. No murmur heard.    No friction rub. No gallop.  Pulmonary:     Effort: Pulmonary effort is normal. No respiratory distress.     Breath sounds: Normal breath sounds.  Abdominal:     Palpations: Abdomen is soft.     Tenderness: There is no abdominal tenderness. There is no guarding or rebound.     Hernia: No hernia is present.  Musculoskeletal:        General: No swelling.     Cervical back: Full passive range of motion without pain, normal range of motion and neck supple. No pain with movement, spinous process tenderness or muscular tenderness.  Normal range of motion.     Right lower leg: No edema.     Left lower leg: No edema.  Skin:    General: Skin is warm and dry.     Capillary Refill: Capillary refill takes less than 2 seconds.     Findings: No ecchymosis, erythema, lesion or wound.  Neurological:     Mental Status: He is alert.     GCS: GCS eye subscore is 4. GCS verbal subscore is 5. GCS motor subscore is 6.     Cranial Nerves: Cranial nerves 2-12 are intact.     Sensory: Sensation is intact.     Motor: Motor function is intact. No weakness or abnormal muscle tone.     Coordination: Coordination is intact.  Psychiatric:        Speech: Speech is slurred.     ED Results / Procedures / Treatments   Labs (all labs ordered are listed, but only abnormal results are  displayed) Labs Reviewed  CBC WITH DIFFERENTIAL/PLATELET - Abnormal; Notable for the following components:      Result Value   RBC 4.01 (*)    MCV 103.2 (*)    Neutro Abs 7.8 (*)    Lymphs Abs 0.6 (*)    All other components within normal limits  COMPREHENSIVE METABOLIC PANEL - Abnormal; Notable for the following components:   Glucose, Bld 126 (*)    Calcium 8.8 (*)    All other components within normal limits  ACETAMINOPHEN LEVEL - Abnormal; Notable for the following components:   Acetaminophen (Tylenol), Serum <10 (*)    All other components within normal limits  SALICYLATE LEVEL - Abnormal; Notable for the following components:   Salicylate Lvl <7.0 (*)    All other components within normal limits  RAPID URINE DRUG SCREEN, HOSP PERFORMED - Abnormal; Notable for the following components:   Cocaine POSITIVE (*)    All other components within normal limits  I-STAT VENOUS BLOOD GAS, ED - Abnormal; Notable for the following components:   pCO2, Ven 64.9 (*)    pO2, Ven 31 (*)    Bicarbonate 29.9 (*)    Calcium, Ion 1.11 (*)    All other components within normal limits  LIPASE, BLOOD  ETHANOL  BLOOD GAS, VENOUS    EKG None  Radiology CT ABDOMEN PELVIS W CONTRAST  Result Date: 06/27/2022 CLINICAL DATA:  31 year old male with abdominal pain. Altered mental status, found naked, agitated. EXAM: CT ABDOMEN AND PELVIS WITH CONTRAST TECHNIQUE: Multidetector CT imaging of the abdomen and pelvis was performed using the standard protocol following bolus administration of intravenous contrast. RADIATION DOSE REDUCTION: This exam was performed according to the departmental dose-optimization program which includes automated exposure control, adjustment of the mA and/or kV according to patient size and/or use of iterative reconstruction technique. CONTRAST:  OMNIPAQUE IOHEXOL 300 MG/ML  SOLN COMPARISON:  CT Abdomen and Pelvis 02/15/2013. FINDINGS: Lower chest: Negative. Hepatobiliary:  Negative liver and gallbladder. Pancreas: Negative. Spleen: Negative. Adrenals/Urinary Tract: Normal adrenal glands. Symmetric renal enhancement with early contrast excretion. No hydronephrosis or pararenal inflammation. Decompressed proximal ureters. Decompressed and unremarkable bladder. Stomach/Bowel: Retained gas and stool in the large bowel which otherwise appears negative. Normal gas-filled appendix visible on series 3, image 61. No dilated small bowel. No free air or free fluid identified. Unremarkable stomach. Decompressed duodenum. Vascular/Lymphatic: Major arterial structures and the portal venous system in the abdomen and pelvis appear patent and normal. No lymphadenopathy identified. Reproductive: Negative. Other: No pelvic free  fluid.  Multiple pelvic phleboliths. Musculoskeletal: Negative. IMPRESSION: Negative CT Abdomen and Pelvis. Electronically Signed   By: Odessa Fleming M.D.   On: 06/27/2022 06:53   CT HEAD WO CONTRAST ( )  Result Date: 06/27/2022 CLINICAL DATA:  31 year old male with altered mental status, found naked, agitated. EXAM: CT HEAD WITHOUT CONTRAST TECHNIQUE: Contiguous axial images were obtained from the base of the skull through the vertex without intravenous contrast. RADIATION DOSE REDUCTION: This exam was performed according to the departmental dose-optimization program which includes automated exposure control, adjustment of the mA and/or kV according to patient size and/or use of iterative reconstruction technique. COMPARISON:  Head CT 04/14/2021. FINDINGS: Brain: No midline shift, ventriculomegaly, mass effect, evidence of mass lesion, intracranial hemorrhage or evidence of cortically based acute infarction. Gray-white matter differentiation is within normal limits throughout the brain. Vascular: No suspicious intracranial vascular hyperdensity. Skull: No acute osseous abnormality identified. Sinuses/Orbits: Visualized paranasal sinuses and mastoids are stable and well aerated.  Other: Visualized orbits and scalp soft tissues are within normal limits. IMPRESSION: Stable and normal noncontrast Head CT. Electronically Signed   By: Odessa Fleming M.D.   On: 06/27/2022 06:49    Procedures Procedures    Medications Ordered in ED Medications  metoCLOPramide (REGLAN) injection 10 mg (10 mg Intravenous Given 06/27/22 0312)  ondansetron (ZOFRAN) injection 4 mg (4 mg Intravenous Given 06/27/22 0501)  lactated ringers bolus 1,000 mL (1,000 mLs Intravenous New Bag/Given 06/27/22 0501)  iohexol (OMNIPAQUE) 300 MG/ML solution 100 mL (100 mLs Intravenous Contrast Given 06/27/22 7893)    ED Course/ Medical Decision Making/ A&P                           Medical Decision Making Amount and/or Complexity of Data Reviewed Labs: ordered. Radiology: ordered.  Risk Prescription drug management.   Brought to the emergency department after he was found lying on the road.  Patient appears to be under the influence of some substance.  He reports drinking a liquid that he found on the roadside.  At arrival to the emergency department he seems intoxicated but is alert.  He answers questions appropriately.  Work-up initiated.  He does not have anion gap or acidosis.  Electrolytes, renal function normal.  No focal findings on exam.  He does have recurrent vomiting here.  Given Reglan and still had vomiting, added Zofran.  CT head, CT abdomen and pelvis.  CT scans are negative.  Images independently viewed and interpreted by me.  Patient monitored overnight.  Vomiting seems to have stopped.  Drug screen positive for cocaine.        Final Clinical Impression(s) / ED Diagnoses Final diagnoses:  Altered mental status, unspecified altered mental status type  Cocaine abuse [facility])    Rx / DC Orders ED Discharge Orders     None         Delvon Chipps, Canary Brim, MD 06/27/22 [phone]

## 2022-06-27 NOTE — ED Notes (Signed)
Pt called for triage w/ no answer x2

## 2022-06-27 NOTE — ED Notes (Signed)
Patient transported to CT 

## 2022-06-27 NOTE — ED Notes (Signed)
Patient verbalizes understanding of d/c instructions. Opportunities for questions and answers were provided. Pt d/c from ED and ambulated to lobby.  

## 2022-11-15 ENCOUNTER — Emergency Department (HOSPITAL_COMMUNITY)
Admission: EM | Admit: 2022-11-15 | Discharge: 2022-11-15 | Disposition: A | Discharge status: Home / Self Care | Payer: No Typology Code available for payment source | Attending: Emergency Medicine | Admitting: Emergency Medicine

## 2022-11-15 DIAGNOSIS — T40601A Poisoning by unspecified narcotics, accidental (unintentional), initial encounter: Secondary | ICD-10-CM

## 2022-11-15 DIAGNOSIS — T402X1A Poisoning by other opioids, accidental (unintentional), initial encounter: Secondary | ICD-10-CM | POA: Insufficient documentation

## 2022-11-15 DIAGNOSIS — T50901A Poisoning by unspecified drugs, medicaments and biological substances, accidental (unintentional), initial encounter: Secondary | ICD-10-CM | POA: Diagnosis present

## 2022-11-15 DIAGNOSIS — X58XXXA Exposure to other specified factors, initial encounter: Secondary | ICD-10-CM | POA: Insufficient documentation

## 2022-11-15 LAB — CBC WITH DIFFERENTIAL/PLATELET
Abs Immature Granulocytes: 0.02 10*3/uL (ref 0.00–0.07)
Basophils Absolute: 0 10*3/uL (ref 0.0–0.1)
Basophils Relative: 0 %
Eosinophils Absolute: 0.2 10*3/uL (ref 0.0–0.5)
Eosinophils Relative: 3 %
HCT: 43.6 % (ref 39.0–52.0)
Hemoglobin: 13.8 g/dL (ref 13.0–17.0)
Immature Granulocytes: 0 %
Lymphocytes Relative: 21 %
Lymphs Abs: 1.8 10*3/uL (ref 0.7–4.0)
MCH: 27.8 pg (ref 26.0–34.0)
MCHC: 31.7 g/dL (ref 30.0–36.0)
MCV: 87.7 fL (ref 80.0–100.0)
Monocytes Absolute: 0.8 10*3/uL (ref 0.1–1.0)
Monocytes Relative: 9 %
Neutro Abs: 5.8 10*3/uL (ref 1.7–7.7)
Neutrophils Relative %: 67 %
Platelets: 254 10*3/uL (ref 150–400)
RBC: 4.97 MIL/uL (ref 4.22–5.81)
RDW: 13.9 % (ref 11.5–15.5)
WBC: 8.7 10*3/uL (ref 4.0–10.5)
nRBC: 0 % (ref 0.0–0.2)

## 2022-11-15 LAB — BASIC METABOLIC PANEL
Anion gap: 8 (ref 5–15)
BUN: 15 mg/dL (ref 6–20)
CO2: 23 mmol/L (ref 22–32)
Calcium: 8.4 mg/dL — ABNORMAL LOW (ref 8.9–10.3)
Chloride: 106 mmol/L (ref 98–111)
Creatinine, Ser: 1.11 mg/dL (ref 0.61–1.24)
GFR, Estimated: >60 mL/min (ref >60)
Glucose, Bld: 91 mg/dL (ref 70–99)
Potassium: 4.4 mmol/L (ref 3.5–5.1)
Sodium: 137 mmol/L (ref 135–145)

## 2022-11-15 MED ORDER — NALOXONE HCL 4 MG/0.1ML NA LIQD
1.0000 | Freq: Once | NASAL | Status: AC
  Administered 2022-11-15: 1 via NASAL
  Filled 2022-11-15: qty 4

## 2022-11-15 NOTE — ED Notes (Signed)
Pt wheeled to his friends room .

## 2022-11-15 NOTE — ED Triage Notes (Signed)
Ems brings pt in from home for overdose. States pt was using what he thought to be cocaine today and became lethargic. Pt complains of being tired.

## 2022-11-15 NOTE — ED Provider Notes (Signed)
[name] DEPT Provider Note   CSN: [id number] Arrival date & time: 11/15/22  1745     History  Chief Complaint  Patient presents with   Drug Overdose    Brett Dunn is a 32 y.o. male.  32 yo M with a chief complaints of altered mental status.  Reportedly the patient thought he was doing cocaine and was doing opiate.  Ended up receiving Narcan on scene and had improvement.  Never fully lost consciousness reportedly.  Did not require any supplemental oxygen.  Patient denies taking any opiates.  Denies suicidal ideation.   Drug Overdose       Home Medications Prior to Admission medications   Medication Sig Start Date End Date Taking? Authorizing Provider  cephALEXin (KEFLEX) 500 MG capsule Take 1 capsule (500 mg total) by mouth 4 (four) times daily. 07/01/18   Loura Halt A, NP  cyclobenzaprine (FLEXERIL) 5 MG tablet Take 1 tablet (5 mg total) by mouth 3 (three) times daily as needed for muscle spasms. Patient not taking: Reported on 07/01/2018 12/21/13   Billy Fischer, MD  ibuprofen (ADVIL,MOTRIN) 800 MG tablet Take 1 tablet (800 mg total) by mouth every 8 (eight) hours as needed for pain. Patient not taking: Reported on 07/01/2018 02/16/13   Truddie Hidden, MD  ibuprofen (ADVIL,MOTRIN) 800 MG tablet Take 1 tablet (800 mg total) by mouth 3 (three) times daily. For back pain Patient not taking: Reported on 07/01/2018 12/21/13   Billy Fischer, MD  oxyCODONE-acetaminophen (PERCOCET/ROXICET) 5-325 MG per tablet Take 1-2 tablets by mouth every 6 (six) hours as needed for pain. Patient not taking: Reported on 07/01/2018 02/16/13   Truddie Hidden, MD      Allergies    Patient has no known allergies.    Review of Systems   Review of Systems  Physical Exam Updated Vital Signs BP 110/84 (BP Location: Right Arm)   Pulse 75   Temp 97.6 F (36.4 C) (Oral)   Resp 18   SpO2 98%  Physical Exam Vitals and nursing note reviewed.  Constitutional:       Appearance: He is well-developed.  HENT:     Head: Normocephalic and atraumatic.  Eyes:     Pupils: Pupils are equal, round, and reactive to light.  Neck:     Vascular: No JVD.  Cardiovascular:     Rate and Rhythm: Normal rate and regular rhythm.     Heart sounds: No murmur heard.    No friction rub. No gallop.  Pulmonary:     Effort: No respiratory distress.     Breath sounds: No wheezing.  Abdominal:     General: There is no distension.     Tenderness: There is no abdominal tenderness. There is no guarding or rebound.  Musculoskeletal:        General: Normal range of motion.     Cervical back: Normal range of motion and neck supple.  Skin:    Coloration: Skin is not pale.     Findings: No rash.  Neurological:     Mental Status: He is alert and oriented to person, place, and time.  Psychiatric:        Behavior: Behavior normal.     ED Results / Procedures / Treatments   Labs (all labs ordered are listed, but only abnormal results are displayed) Labs Reviewed  BASIC METABOLIC PANEL - Abnormal; Notable for the following components:      Result Value  Calcium 8.4 (*)    All other components within normal limits  CBC WITH DIFFERENTIAL/PLATELET  CBC WITH DIFFERENTIAL/PLATELET    EKG None  Radiology No results found.  Procedures Procedures    Medications Ordered in ED Medications  naloxone (NARCAN) nasal spray 4 mg/0.1 mL (has no administration in time range)    ED Course/ Medical Decision Making/ A&P                           Medical Decision Making Amount and/or Complexity of Data Reviewed Labs: ordered.   32 yo M with a chief complaints of unresponsiveness.  This was reported by EMS.  Reportedly the patient thought he was doing cocaine and had taken an opioid.  He is awake and alert.  He has no complaints.  Will observe in the ED.  Patient observed for couple hours in the ED without recurrence.  He is able to ambulate independently is eating and  drinking.  Will discharge him at this time.  Have him follow-up with his PCP in the office.  Given Narcan for home.  9:08 PM:  I have discussed the diagnosis/risks/treatment options with the patient.  Evaluation and diagnostic testing in the emergency department does not suggest an emergent condition requiring admission or immediate intervention beyond what has been performed at this time.  They will follow up with PCP. We also discussed returning to the ED immediately if new or worsening sx occur. We discussed the sx which are most concerning (e.g., sudden worsening pain, fever, inability to tolerate by mouth) that necessitate immediate return. Medications administered to the patient during their visit and any new prescriptions provided to the patient are listed below.  Medications given during this visit Medications  naloxone (NARCAN) nasal spray 4 mg/0.1 mL (has no administration in time range)     The patient appears reasonably screen and/or stabilized for discharge and I doubt any other medical condition or other [facility] requiring further screening, evaluation, or treatment in the ED at this time prior to discharge.          Final Clinical Impression(s) / ED Diagnoses Final diagnoses:  Opiate overdose, accidental or unintentional, initial encounter [facility])    Rx / [place] Orders ED Discharge Orders     None         Deno Etienne, DO 11/15/22 2108

## 2022-11-15 NOTE — Discharge Instructions (Signed)
There is help if you need it.  Please do not use dirty needles, this could cause you a severe infection to your skin, heart or spinal cord.  This could kill you or leave you permanently disabled.  There was a recent study done at [facility] that showed that the risk of death for someone that had unintentionally overdosed on narcotics was as high as 15% in the next year.  This is much higher than most every other medical condition.  [place] [facility] to the Opioid Problem ([facility]STOP) Fixed; mobile; peer-based Chase Holleman [phone] [email or web address] Fixed site exchange at [employer], Wednesdays (2-5pm) and Thursdays (4-8pm). [address] [place], [place] [address] Call or text to arrange mobile and peer exchange, Mondays (1-4pm) and Fridays (4-7pm). Serving [place] [email or web address]  Suboxone clinic: [facility] [address] [place], [address]  [facility] [address] [place], [address]  [facility] [address] [place] [address]  

## 2023-09-22 ENCOUNTER — Emergency Department (HOSPITAL_COMMUNITY)
Admission: EM | Admit: 2023-09-22 | Discharge: 2023-09-22 | Disposition: A | Discharge status: Home / Self Care | Payer: No Typology Code available for payment source | Attending: Emergency Medicine | Admitting: Emergency Medicine

## 2023-09-22 ENCOUNTER — Other Ambulatory Visit: Payer: Self-pay

## 2023-09-22 ENCOUNTER — Emergency Department (HOSPITAL_COMMUNITY): Payer: No Typology Code available for payment source

## 2023-09-22 DIAGNOSIS — Y92410 Unspecified street and highway as the place of occurrence of the external cause: Secondary | ICD-10-CM | POA: Insufficient documentation

## 2023-09-22 DIAGNOSIS — F17200 Nicotine dependence, unspecified, uncomplicated: Secondary | ICD-10-CM | POA: Insufficient documentation

## 2023-09-22 DIAGNOSIS — M545 Low back pain, unspecified: Secondary | ICD-10-CM | POA: Diagnosis present

## 2023-09-22 LAB — CBC WITH DIFFERENTIAL/PLATELET
Abs Immature Granulocytes: 0.01 10*3/uL (ref 0.00–0.07)
Basophils Absolute: 0 10*3/uL (ref 0.0–0.1)
Basophils Relative: 1 %
Eosinophils Absolute: 0.1 10*3/uL (ref 0.0–0.5)
Eosinophils Relative: 2 %
HCT: 44.5 % (ref 39.0–52.0)
Hemoglobin: 13.7 g/dL (ref 13.0–17.0)
Immature Granulocytes: 0 %
Lymphocytes Relative: 38 %
Lymphs Abs: 1.8 10*3/uL (ref 0.7–4.0)
MCH: 27.2 pg (ref 26.0–34.0)
MCHC: 30.8 g/dL (ref 30.0–36.0)
MCV: 88.3 fL (ref 80.0–100.0)
Monocytes Absolute: 0.6 10*3/uL (ref 0.1–1.0)
Monocytes Relative: 13 %
Neutro Abs: 2.2 10*3/uL (ref 1.7–7.7)
Neutrophils Relative %: 46 %
Platelets: 261 10*3/uL (ref 150–400)
RBC: 5.04 MIL/uL (ref 4.22–5.81)
RDW: 14.5 % (ref 11.5–15.5)
WBC: 4.7 10*3/uL (ref 4.0–10.5)
nRBC: 0 % (ref 0.0–0.2)

## 2023-09-22 LAB — COMPREHENSIVE METABOLIC PANEL
ALT: 11 U/L (ref 0–44)
AST: 25 U/L (ref 15–41)
Albumin: 3.9 g/dL (ref 3.5–5.0)
Alkaline Phosphatase: 51 U/L (ref 38–126)
Anion gap: 7 (ref 5–15)
BUN: 10 mg/dL (ref 6–20)
CO2: 26 mmol/L (ref 22–32)
Calcium: 8.7 mg/dL — ABNORMAL LOW (ref 8.9–10.3)
Chloride: 108 mmol/L (ref 98–111)
Creatinine, Ser: 1.33 mg/dL — ABNORMAL HIGH (ref 0.61–1.24)
GFR, Estimated: >60 mL/min (ref >60)
Glucose, Bld: 88 mg/dL (ref 70–99)
Potassium: 4.2 mmol/L (ref 3.5–5.1)
Sodium: 141 mmol/L (ref 135–145)
Total Bilirubin: 0.5 mg/dL (ref <1.2)
Total Protein: 7 g/dL (ref 6.5–8.1)

## 2023-09-22 MED ORDER — ACETAMINOPHEN 500 MG PO TABS
1000.0000 mg | ORAL_TABLET | Freq: Once | ORAL | Status: AC
Start: 2023-09-22 — End: 2023-09-22
  Administered 2023-09-22: 1000 mg via ORAL
  Filled 2023-09-22: qty 2
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
## 2023-09-22 NOTE — Discharge Instructions (Addendum)
You can take Tylenol and ibuprofen at home.  Return to emergency department if you lose consciousness, develop new or worsening pain, have difficulty walking.

## 2023-09-22 NOTE — ED Notes (Signed)
Pt states that he does not want an Xray, and wants to be discharged.
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
